# Patient Record
Sex: Female | Born: 1950 | Smoking: Never smoker
Health system: Southern US, Community
[De-identification: ages and names within clinical notes are randomized; demographics above are authoritative.]

## PROBLEM LIST (undated history)

## (undated) DIAGNOSIS — E538 Deficiency of other specified B group vitamins: Secondary | ICD-10-CM

## (undated) DIAGNOSIS — J45909 Unspecified asthma, uncomplicated: Secondary | ICD-10-CM

## (undated) HISTORY — PX: EYE SURGERY: SHX253

## (undated) HISTORY — PX: TUBAL LIGATION: SHX77

## (undated) HISTORY — PX: BUNIONECTOMY: SHX129

---

## 2004-11-21 ENCOUNTER — Ambulatory Visit: Payer: Self-pay | Admitting: Obstetrics and Gynecology

## 2005-10-03 ENCOUNTER — Ambulatory Visit: Payer: Self-pay | Admitting: Podiatry

## 2005-11-27 ENCOUNTER — Ambulatory Visit: Payer: Self-pay | Admitting: Obstetrics and Gynecology

## 2006-12-02 ENCOUNTER — Ambulatory Visit: Payer: Self-pay | Admitting: Obstetrics and Gynecology

## 2007-09-11 ENCOUNTER — Ambulatory Visit: Payer: Self-pay | Admitting: Gastroenterology

## 2007-12-31 ENCOUNTER — Ambulatory Visit: Payer: Self-pay | Admitting: Otolaryngology

## 2008-01-26 ENCOUNTER — Ambulatory Visit: Payer: Self-pay | Admitting: Obstetrics and Gynecology

## 2008-02-26 ENCOUNTER — Ambulatory Visit: Payer: Self-pay | Admitting: Gastroenterology

## 2008-06-17 ENCOUNTER — Ambulatory Visit: Payer: Self-pay | Admitting: Gastroenterology

## 2009-02-15 ENCOUNTER — Ambulatory Visit: Payer: Self-pay | Admitting: Obstetrics and Gynecology

## 2009-04-12 ENCOUNTER — Ambulatory Visit: Payer: Self-pay | Admitting: Otolaryngology

## 2009-05-18 ENCOUNTER — Ambulatory Visit: Payer: Self-pay | Admitting: Otolaryngology

## 2009-08-17 ENCOUNTER — Ambulatory Visit: Payer: Self-pay | Admitting: Gastroenterology

## 2009-10-06 ENCOUNTER — Ambulatory Visit: Payer: Self-pay | Admitting: Podiatry

## 2009-10-12 ENCOUNTER — Ambulatory Visit: Payer: Self-pay | Admitting: Gastroenterology

## 2011-08-14 ENCOUNTER — Ambulatory Visit: Payer: Self-pay | Admitting: Family Medicine

## 2014-02-01 ENCOUNTER — Ambulatory Visit: Payer: Self-pay | Admitting: Family Medicine

## 2015-10-11 ENCOUNTER — Emergency Department: Payer: No Typology Code available for payment source

## 2015-10-11 ENCOUNTER — Emergency Department
Admission: EM | Admit: 2015-10-11 | Discharge: 2015-10-11 | Disposition: A | Discharge status: Home / Self Care | Payer: No Typology Code available for payment source | Attending: Emergency Medicine | Admitting: Emergency Medicine

## 2015-10-11 DIAGNOSIS — R413 Other amnesia: Secondary | ICD-10-CM | POA: Diagnosis present

## 2015-10-11 DIAGNOSIS — G459 Transient cerebral ischemic attack, unspecified: Secondary | ICD-10-CM | POA: Insufficient documentation

## 2015-10-11 HISTORY — DX: Deficiency of other specified B group vitamins: E53.8

## 2015-10-11 HISTORY — DX: Unspecified asthma, uncomplicated: J45.909

## 2015-10-11 LAB — DIFFERENTIAL
BASOS PCT: 1 %
Basophils Absolute: 0 10*3/uL (ref 0–0.1)
EOS PCT: 1 %
Eosinophils Absolute: 0 10*3/uL (ref 0–0.7)
Lymphocytes Relative: 13 %
Lymphs Abs: 1.1 10*3/uL (ref 1.0–3.6)
MONO ABS: 0.6 10*3/uL (ref 0.2–0.9)
Monocytes Relative: 7 %
Neutro Abs: 6.3 10*3/uL (ref 1.4–6.5)
Neutrophils Relative %: 78 %

## 2015-10-11 LAB — CBC
HCT: 41.8 % (ref 35.0–47.0)
Hemoglobin: 14 g/dL (ref 12.0–16.0)
MCH: 31 pg (ref 26.0–34.0)
MCHC: 33.4 g/dL (ref 32.0–36.0)
MCV: 92.7 fL (ref 80.0–100.0)
PLATELETS: 230 10*3/uL (ref 150–440)
RBC: 4.51 MIL/uL (ref 3.80–5.20)
RDW: 12.9 % (ref 11.5–14.5)
WBC: 8.1 10*3/uL (ref 3.6–11.0)

## 2015-10-11 LAB — COMPREHENSIVE METABOLIC PANEL
ALT: 22 U/L (ref 14–54)
ANION GAP: 6 (ref 5–15)
AST: 24 U/L (ref 15–41)
Albumin: 4.8 g/dL (ref 3.5–5.0)
Alkaline Phosphatase: 59 U/L (ref 38–126)
BUN: 14 mg/dL (ref 6–20)
CHLORIDE: 104 mmol/L (ref 101–111)
CO2: 28 mmol/L (ref 22–32)
Calcium: 9.7 mg/dL (ref 8.9–10.3)
Creatinine, Ser: 0.91 mg/dL (ref 0.44–1.00)
GFR calc non Af Amer: >60 mL/min (ref >60)
Glucose, Bld: 119 mg/dL — ABNORMAL HIGH (ref 65–99)
POTASSIUM: 4.3 mmol/L (ref 3.5–5.1)
SODIUM: 138 mmol/L (ref 135–145)
Total Bilirubin: 0.8 mg/dL (ref 0.3–1.2)
Total Protein: 7.9 g/dL (ref 6.5–8.1)

## 2015-10-11 LAB — GLUCOSE, CAPILLARY: GLUCOSE-CAPILLARY: 112 mg/dL — AB (ref 65–99)

## 2015-10-11 LAB — TROPONIN I: Troponin I: <0.03 ng/mL (ref <0.031)

## 2015-10-11 LAB — PROTIME-INR
INR: 1.04
PROTHROMBIN TIME: 13.8 s (ref 11.4–15.0)

## 2015-10-11 LAB — APTT: aPTT: 26 seconds (ref 24–36)

## 2015-10-11 MED ORDER — GADOBENATE DIMEGLUMINE 529 MG/ML IV SOLN
15.0000 mL | Freq: Once | INTRAVENOUS | Status: AC | PRN
  Administered 2015-10-11: 15 mL via INTRAVENOUS

## 2015-10-11 MED ORDER — ASPIRIN EC 81 MG PO TBEC: 81.0000 mg | DELAYED_RELEASE_TABLET | Freq: Every day | ORAL | Status: AC

## 2015-10-11 MED ORDER — ASPIRIN 81 MG PO CHEW
324.0000 mg | CHEWABLE_TABLET | Freq: Once | ORAL | Status: AC
  Administered 2015-10-11: 324 mg via ORAL
  Filled 2015-10-11: qty 4

## 2015-10-11 NOTE — ED Notes (Signed)
Pt c/o having trouble remembering things today, called a friend around 11am . States she does not remember buying presents under tree, states she does not remember breaking up with boyfriend a month ago but remember him, name etc.. Pt is tearful..  No noted neuro deficits.. Denies any weakness, no  Facial droop..Marland Kitchen

## 2015-10-11 NOTE — ED Notes (Signed)
MD at bedside. 

## 2015-10-11 NOTE — ED Notes (Signed)
Patient transported to MRI 

## 2015-10-11 NOTE — Consult Note (Signed)
[facility] [place] [facility]   PATIENT NAME: April Figueroa    MR#:  [id number]  DATE OF BIRTH:  1951/09/02  DATE OF ADMISSION:  10/11/2015  PRIMARY CARE PHYSICIAN: No primary care provider on file.   REQUESTING/REFERRING PHYSICIAN: Dr Thomasene Mohair  CHIEF COMPLAINT:   Chief Complaint  Patient presents with  . Memory Loss    HISTORY OF PRESENT ILLNESS:  April Figueroa  is a 64 y.o. female with a known history of B12 deficiency and asthma comes to the emergency room after she had experienced memory loss for several hours. Patient remembers waking up at 7 in the morning. She did some household chores. Thereafter she does not remember but doesn't remember calling a friend and asking about Christmas presence and where she bothered from. Friend came over to see patient and was found somewhat little confused. Brought in the way here to the emergency room. Since being in the emergency room patient has been asymptomatic. At home she did not have any focal weakness slurred speech or dysarthria. Denied any blurred vision either. ER course was pretty unremarkable vitals stable CT head was negative. MRI of the brain was done which is negative for acute infarct.  PAST MEDICAL HISTORY:   Past Medical History  Diagnosis Date  . Asthma   . B12 deficiency     PAST SURGICAL HISTOIRY:   Past Surgical History  Procedure Laterality Date  . Tubal ligation    . Eye surgery    . Bunionectomy      SOCIAL HISTORY:   Social History  Substance Use Topics  . Smoking status: Never Smoker   . Smokeless tobacco: Not on file  . Alcohol Use: No    FAMILY HISTORY:  No family history on file.  DRUG ALLERGIES:   Allergies  Allergen Reactions  . Sudafed [Pseudoephedrine]     Reaction: heart palpitations     REVIEW OF SYSTEMS:   ROS  CONSTITUTIONAL: No fever, fatigue or weakness.  EYES: No blurred or double vision.  EARS, NOSE, AND THROAT: No tinnitus  or ear pain.  RESPIRATORY: No cough, shortness of breath, wheezing or hemoptysis.  CARDIOVASCULAR: No chest pain, orthopnea, edema.  GASTROINTESTINAL: No nausea, vomiting, diarrhea or abdominal pain.  GENITOURINARY: No dysuria, hematuria.  ENDOCRINE: No polyuria, nocturia,  HEMATOLOGY: No anemia, easy bruising or bleeding SKIN: No rash or lesion. MUSCULOSKELETAL: No joint pain or arthritis.   NEUROLOGIC: No tingling, numbness, weakness. Positive for memory loss transient PSYCHIATRY: No anxiety or depression.   MEDICATIONS AT HOME:   Prior to Admission medications   Medication Sig Start Date End Date Taking? Authorizing Provider  cyanocobalamin 100 MCG tablet Take 100 mcg by mouth daily.   Yes Historical Provider, MD  aspirin EC 81 MG tablet Take 1 tablet (81 mg total) by mouth daily. 10/11/15   Enedina Finner, MD      VITAL SIGNS:  Blood pressure 134/57, pulse 60, temperature 97.6 F (36.4 C), temperature source Oral, resp. rate 18, height  (1.6 m), weight 75.751 kg (167 lb), SpO2 99 %.  PHYSICAL EXAMINATION:  GENERAL:  64 y.o.-year-old patient lying in the bed with no acute distress.  EYES: Pupils equal, round, reactive to light and accommodation. No scleral icterus. Extraocular muscles intact.  HEENT: Head atraumatic, normocephalic. Oropharynx and nasopharynx clear.  NECK:  Supple, no jugular venous distention. No thyroid enlargement, no tenderness.  LUNGS: Normal breath sounds bilaterally, no wheezing, rales,rhonchi or crepitation. No use of accessory  muscles of respiration.  CARDIOVASCULAR: S1, S2 normal. No murmurs, rubs, or gallops.  ABDOMEN: Soft, nontender, nondistended. Bowel sounds present. No organomegaly or mass.  EXTREMITIES: No pedal edema, cyanosis, or clubbing.  NEUROLOGIC: Cranial nerves II through XII are intact. Muscle strength 5/5 in all extremities. Sensation intact. Gait not checked.  PSYCHIATRIC: The patient is alert and oriented x 3.  SKIN: No obvious  rash, lesion, or ulcer.   LABORATORY PANEL:   CBC  Recent Labs Lab 10/11/15 1300  WBC 8.1  HGB 14.0  HCT 41.8  PLT 230   ------------------------------------------------------------------------------------------------------------------  Chemistries   Recent Labs Lab 10/11/15 1300  NA 138  K 4.3  CL 104  CO2 28  GLUCOSE 119*  BUN 14  CREATININE 0.91  CALCIUM 9.7  AST 24  ALT 22  ALKPHOS 59  BILITOT 0.8   ------------------------------------------------------------------------------------------------------------------  Cardiac Enzymes  Recent Labs Lab 10/11/15 1300  TROPONINI <0.03   ------------------------------------------------------------------------------------------------------------------  RADIOLOGY:  Ct Head Wo Contrast  10/11/2015  CLINICAL DATA:  Altered mental status. EXAM: CT HEAD WITHOUT CONTRAST TECHNIQUE: Contiguous axial images were obtained from the base of the skull through the vertex without intravenous contrast. COMPARISON:  MRI 04/12/2009 FINDINGS: No acute intracranial abnormality. Specifically, no hemorrhage, hydrocephalus, mass lesion, acute infarction, or significant intracranial injury. No acute calvarial abnormality. Visualized paranasal sinuses and mastoids clear. Orbital soft tissues unremarkable. IMPRESSION: Negative. Electronically Signed   By: Charlett NoseKevin  Dover M.D.   On: 10/11/2015 12:54   Mr Laqueta JeanBrain W ZOWo Contrast  10/11/2015  CLINICAL DATA:  Acute onset of short-term memory loss this morning. EXAM: MRI HEAD WITHOUT AND WITH CONTRAST TECHNIQUE: Multiplanar, multiecho pulse sequences of the brain and surrounding structures were obtained without and with intravenous contrast. CONTRAST:  15mL MULTIHANCE GADOBENATE DIMEGLUMINE 529 MG/ML IV SOLN COMPARISON:  CT head without contrast from the same day. MRI brain 04/12/2009 FINDINGS: The diffusion-weighted images demonstrate no evidence for acute or subacute infarction. No acute hemorrhage or mass  lesion is present. There is no significant white matter disease. The ventricles are of normal size. No significant extra-axial fluid collection is present. The internal auditory canals are within normal limits. Flow is present in the major intracranial arteries. The globes and orbits are intact. Mild ethmoid and frontal sinus mucosal thickening is present. Bilateral maxillary and ethmoid sinus surgery is again noted. The left maxillary sinuses shrunken. The skullbase is otherwise within normal limits. Midline sagittal images are unremarkable. The postcontrast images demonstrate no pathologic enhancement. IMPRESSION: 1. Normal MRI the brain. No acute or focal lesion to explain the patient's acute memory loss. 2. Stable appearance of maxillary and ethmoid sinus surgery with residual mucosal disease in the anterior ethmoid air cells and frontal sinuses bilaterally. Electronically Signed   By: Marin Robertshristopher  Mattern M.D.   On: 10/11/2015 16:51   Dg Chest Portable 1 View  10/11/2015  CLINICAL DATA:  Trouble remembering things today. EXAM: PORTABLE CHEST 1 VIEW COMPARISON:  None. FINDINGS: The heart size and mediastinal contours are within normal limits. Both lungs are clear. The visualized skeletal structures are unremarkable. IMPRESSION: No active disease. Electronically Signed   By: Charlett NoseKevin  Dover M.D.   On: 10/11/2015 13:44    EKG:   Orders placed or performed during the hospital encounter of 10/11/15  . ED EKG  . ED EKG  . ED EKG  . ED EKG  . EKG 12-Lead  . EKG 12-Lead    IMPRESSION AND PLAN:   64 year old Ms. Forester with past medical  history of asthma, B12 deficiency comes to the emergency room after she had transient memory loss this morning. She was evaluated in the emergency room remained hemodynamically stable. Patient denies any focal weakness. She did not have any focal weakness in the emergency room. CT of the head was negative. Patient remained in sinus rhythm. MRI of the brain was done  did not show any evidence of acute stroke. Patient feels better. Etiology of her transient memory loss unclear at this time. She is recommended to follow up with her primary care physician. She is also advised to follow-up with neurology if the symptoms recur. She was understanding. Patient will be discharged to home and follow up with her primary care physician as needed. She is advised to start a baby aspirin 81 mg daily.    All the records are reviewed and case discussed with Consulting provider. Management plans discussed with the patient, family and they are in agreement.  CODE STATUS: Full  TOTAL TIME TAKING CARE OF THIS PATIENT:45 minutes.    Lexis Potenza M.D on 10/11/2015 at 5:29 PM  Between 7am to 6pm - Pager - [phone]  After 6pm go to [email or web address] - password [email or web address]  [facility]  Office  [phone]  CC: Primary care Physician: No primary care provider on file.

## 2015-10-11 NOTE — ED Notes (Signed)
Pt back from MRI 

## 2015-10-11 NOTE — ED Provider Notes (Addendum)
[facility] Emergency Department Provider Note  Time seen: 1:26 PM  I have reviewed the triage vital signs and the nursing notes.   HISTORY  Chief Complaint Memory Loss    HPI April Figueroa is a 64 y.o. female with a past medical history of asthma who presents the emergency department with difficulty remembering events this morning. According to the patient she called her friend around 5211 AM saying that she was not able to remember several things including whose trailer was in her driveway, and did not remember placing presents under the Christmas tree. She did not remember her ex boyfriend.She called her friend, and expressed these feelings to him, he states he was able to explain to her some of the memory deficits, for example who her ex-boyfriend is, and she would remember. States that she was talking normal whole time, denies any slurred speech, patient denies any weakness or numbness of any arm or leg. States her long-term memory is intact, she remembers everything before around 10 AM this morning, but there is an hour or 2 this morning or she is having difficulty remembering things. States this has never happened before.     Past Medical History  Diagnosis Date  . Asthma   . B12 deficiency     There are no active problems to display for this patient.   Past Surgical History  Procedure Laterality Date  . Tubal ligation    . Eye surgery    . Bunionectomy      No current outpatient prescriptions on file.  Allergies Review of patient's allergies indicates no known allergies.  No family history on file.  Social History Social History  Substance Use Topics  . Smoking status: Never Smoker   . Smokeless tobacco: None  . Alcohol Use: No    Review of Systems Constitutional: Negative for fever. Positive short-term memory impairment. Cardiovascular: Negative for chest pain. Respiratory: Negative for shortness of breath. Gastrointestinal:  Negative for abdominal pain Musculoskeletal: Negative for back pain. Neurological: Negative for headache. Denies focal weakness or numbness. 10-point ROS otherwise negative.  ____________________________________________   PHYSICAL EXAM:  VITAL SIGNS: ED Triage Vitals  Enc Vitals Group     BP 10/11/15 1227 154/62 mmHg     Pulse Rate 10/11/15 1227 71     Resp 10/11/15 1227 18     Temp 10/11/15 1227 97.6 F (36.4 C)     Temp Source 10/11/15 1227 Oral     SpO2 10/11/15 1227 98 %     Weight 10/11/15 1227 167 lb (75.751 kg)     Height 10/11/15 1227 5\' 3"  (1.6 m)     Head Cir --      Peak Flow --      Pain Score --      Pain Loc --      Pain Edu? --      Excl. in GC? --    Constitutional: Alert and oriented 4 Well appearing and in no distress. Eyes: Normal exam ENT   Head: Normocephalic and atraumatic.   Mouth/Throat: Mucous membranes are moist. Cardiovascular: Normal rate, regular rhythm. No murmur Respiratory: Normal respiratory effort without tachypnea nor retractions. Breath sounds are clear Gastrointestinal: Soft and nontender. No distention.  Musculoskeletal: Nontender with normal range of motion in all extremities. No lower extremity tenderness or edema. Neurologic:  Normal speech and language. No gross focal neurologic deficits are appreciated. Equal grip strengths. No pronator drift. 5/5 motor in all extremities. Cranial nerves intact.  Skin:  Skin is warm, dry and intact.  Psychiatric: Mood and affect are normal. Speech and behavior are normal.  ____________________________________________    EKG  EKG reviewed and interpreted by myself shows normal sinus rhythm at 64 bpm, narrow QRS, normal axis, normal intervals, no ST changes. Overall normal EKG.  ____________________________________________    RADIOLOGY  CT head negative  ____________________________________________   INITIAL IMPRESSION / ASSESSMENT AND PLAN / ED COURSE  Pertinent labs &  imaging results that were available during my care of the patient were reviewed by me and considered in my medical decision making (see chart for details).  Patient presents with memory impairment this morning. Now states she feels back to normal. Symptoms most consistent with transient ischemic attack. CT head negative, currently awaiting lab results. We will dose aspirin while awaiting lab results.  Patient's labs are within normal limits. EKG is normal. I discussed with the patient MRI in the emergency department, and possibility of outpatient workup. The patient is very concerned as this has never happened before, and would much prefer to be admitted to the hospital for a stroke workup. Given the patient's deficits to believe this is warranted, we will admit to the hospital for further treatment and evaluation.   NIH Stroke Scale   Interval: Baseline Time: 1:51 PM Person Administering Scale: Devarion Mcclanahan  Administer stroke scale items in the order listed. Record performance in each category after each subscale exam. Do not go back and change scores. Follow directions provided for each exam technique. Scores should reflect what the patient does, not what the clinician thinks the patient can do. The clinician should record answers while administering the exam and work quickly. Except where indicated, the patient should not be coached (i.e., repeated requests to patient to make a special effort).   1a  Level of consciousness: 0=alert; keenly responsive  1b. LOC questions:  0=Performs both tasks correctly  1c. LOC commands: 0=Performs both tasks correctly  2.  Best Gaze: 0=normal  3.  Visual: 0=No visual loss  4. Facial Palsy: 0=Normal symmetric movement  5a.  Motor left arm: 0=No drift, limb holds 90 (or 45) degrees for full 10 seconds  5b.  Motor right arm: 0=No drift, limb holds 90 (or 45) degrees for full 10 seconds  6a. motor left leg: 0=No drift, limb holds 90 (or 45) degrees  for full 10 seconds  6b  Motor right leg:  0=No drift, limb holds 90 (or 45) degrees for full 10 seconds  7. Limb Ataxia: 0=Absent  8.  Sensory: 0=Normal; no sensory loss  9. Best Language:  0=No aphasia, normal  10. Dysarthria: 0=Normal  11. Extinction and Inattention: 0=No abnormality  12. Distal motor function: 0=Normal   Total:   0    ____________________________________________   FINAL CLINICAL IMPRESSION(S) / ED DIAGNOSES  Transient ischemic attack   Minna Antis, MD 10/11/15 1350  Minna Antis, MD 10/11/15 1351

## 2017-07-28 IMAGING — MR MR HEAD WO/W CM
12 series · 48 of 48 positions shown · IV contrast (15 ML MULTIHANCE)
Comparison: CT head without contrast from the same day. MRI brain
04/12/2009

CLINICAL DATA: Acute onset of short-term memory loss this morning.

EXAM:
MRI HEAD WITHOUT AND WITH CONTRAST
TECHNIQUE: Multiplanar, multiecho pulse sequences of the brain and surrounding
structures were obtained without and with intravenous contrast.
CONTRAST:  15mL MULTIHANCE GADOBENATE DIMEGLUMINE 529 MG/ML IV SOLN

[Series 2: T1 · sagittal · 5.0mm · 0.45mm/px · 4 of 27 slices shown (1 of 2)]
[im 1/27]
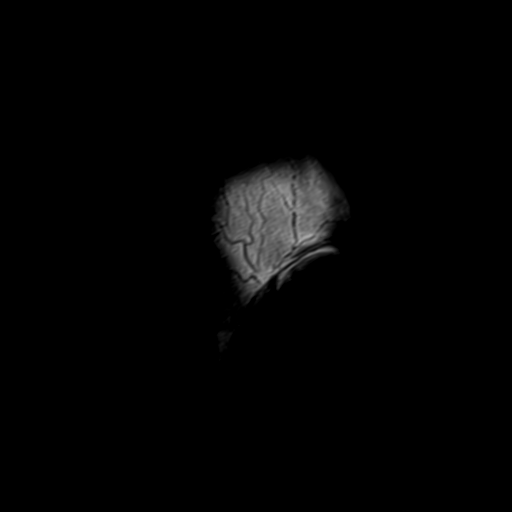
[im 9/27]
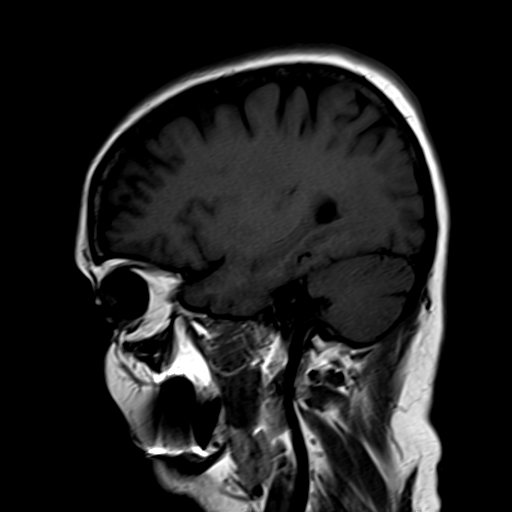
[im 18/27]
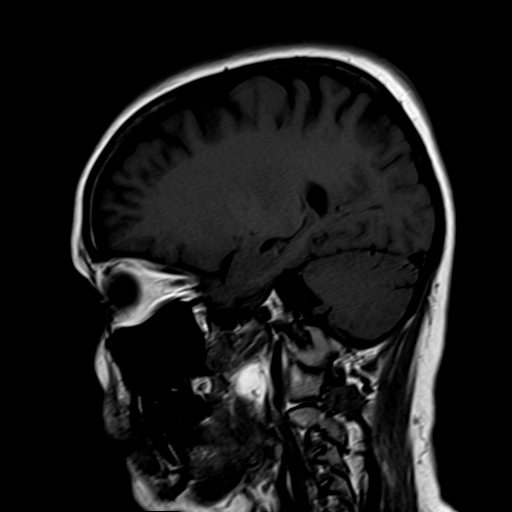
[im 27/27]
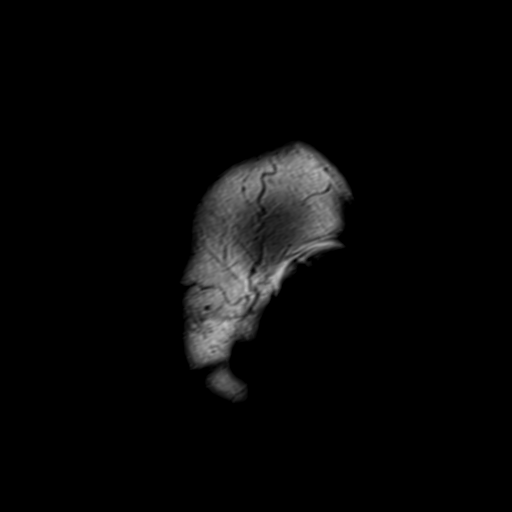

[Series 4: DWI · axial · 3.0mm · 1.80mm/px · z∈[-79,+80]mm · 5 of 54 slices shown (1 of 4)]
[im 1/54]
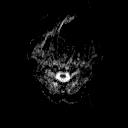
[im 14/54]
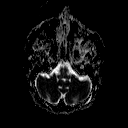
[im 27/54]
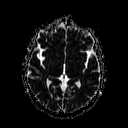
[im 40/54]
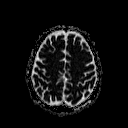
[im 54/54]
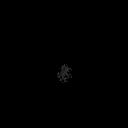

[Series 6: DWI · coronal · 3.0mm · 1.80mm/px · 5 of 46 slices shown (2 of 4)]
[im 1/46]
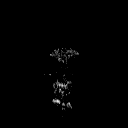
[im 12/46]
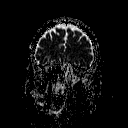
[im 23/46]
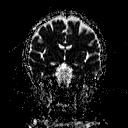
[im 34/46]
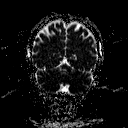
[im 46/46]
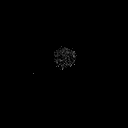

[Series 7: T2 · axial · 5.0mm · 0.60mm/px · z∈[-77,+76]mm · 2 of 25 slices shown (1 of 2)]
[im 1/25]
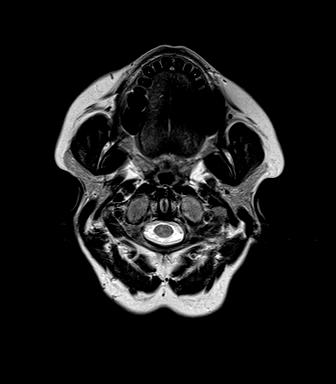
[im 25/25]
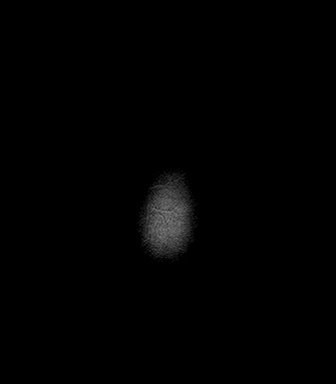

[Series 8: FLAIR · axial · 5.0mm · 0.45mm/px · z∈[-77,+76]mm · 2 of 25 slices shown]
[im 1/25]
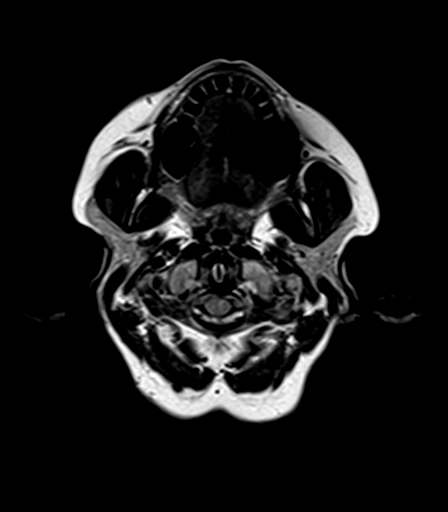
[im 25/25]
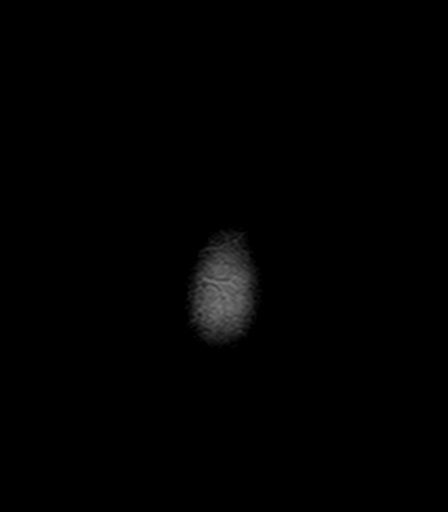

[Series 9: T2 · axial · 5.0mm · 0.45mm/px · z∈[-77,+76]mm · 2 of 25 slices shown (2 of 2)]
[im 1/25]
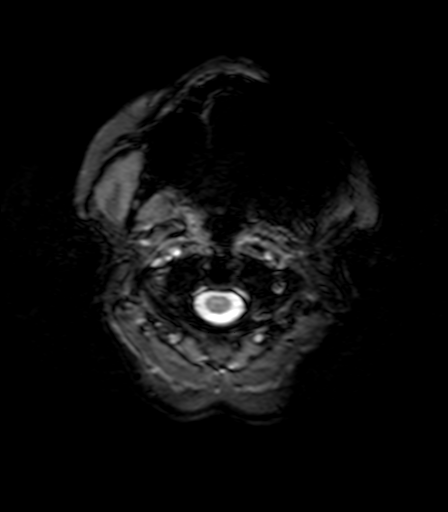
[im 25/25]
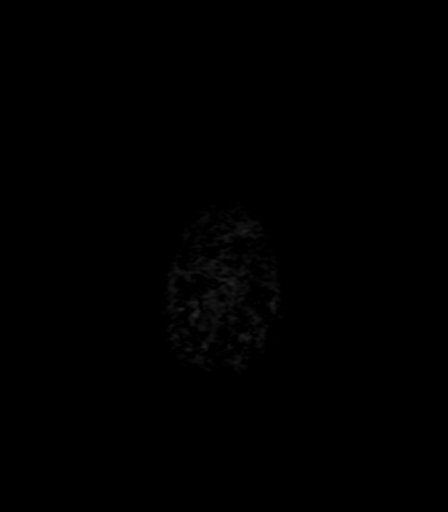

[Series 10: T1 · axial · 3.0mm · 1.00mm/px · z∈[-90,+84]mm · 6 of 60 slices shown (2 of 2)]
[im 1/60]
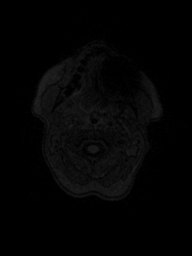
[im 12/60]
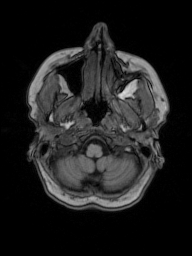
[im 24/60]
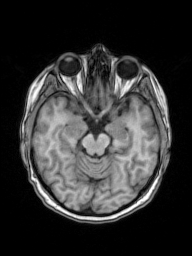
[im 36/60]
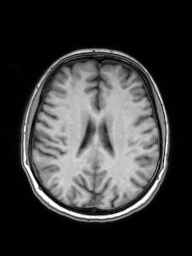
[im 48/60]
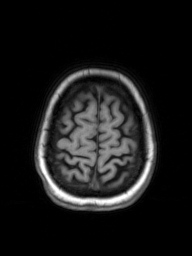
[im 60/60]
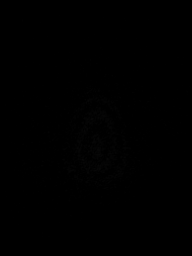

[Series 11: T2 post-contrast · coronal · 5.0mm · 0.49mm/px · 3 of 29 slices shown]
[im 1/29]
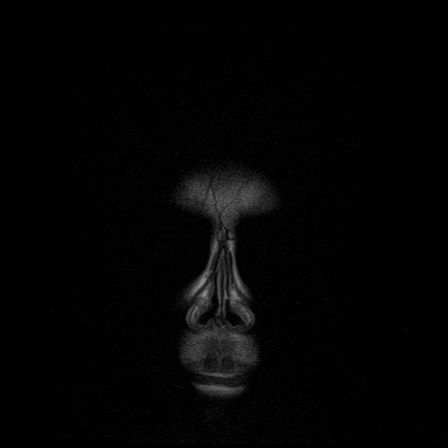
[im 15/29]
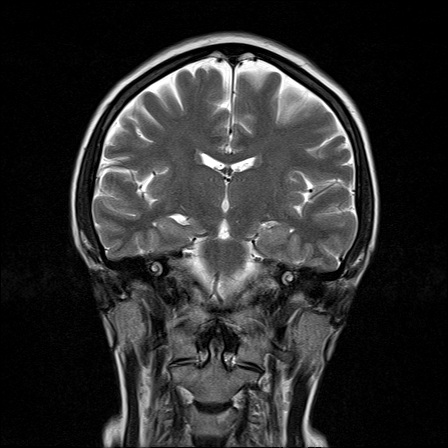
[im 29/29]
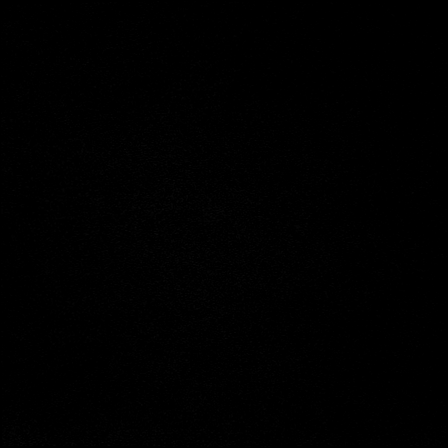

[Series 12: T1 post-contrast · axial · 3.0mm · 1.00mm/px · z∈[-90,+84]mm · 6 of 60 slices shown (1 of 2)]
[im 1/60]
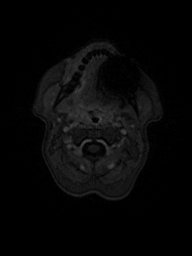
[im 12/60]
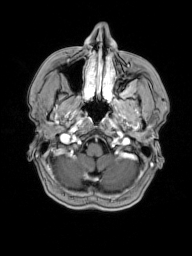
[im 24/60]
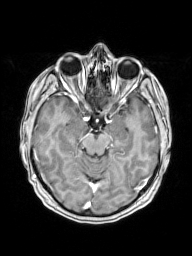
[im 36/60]
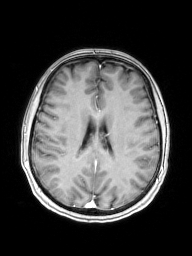
[im 48/60]
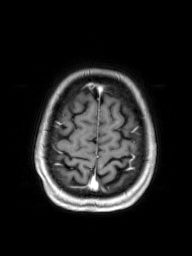
[im 60/60]
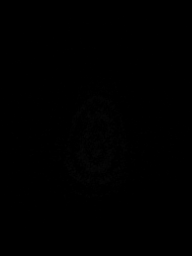

[Series 13: T1 post-contrast · coronal · 5.0mm · 0.43mm/px · 3 of 29 slices shown (2 of 2)]
[im 1/29]
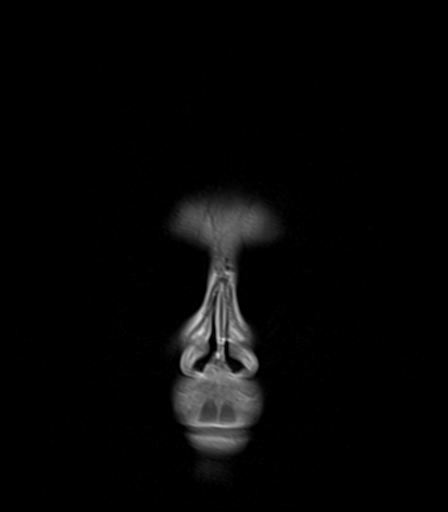
[im 15/29]
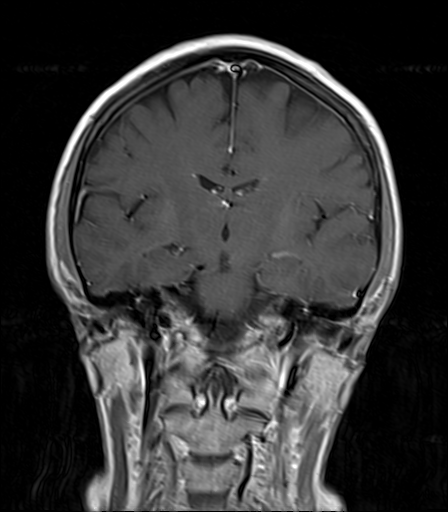
[im 29/29]
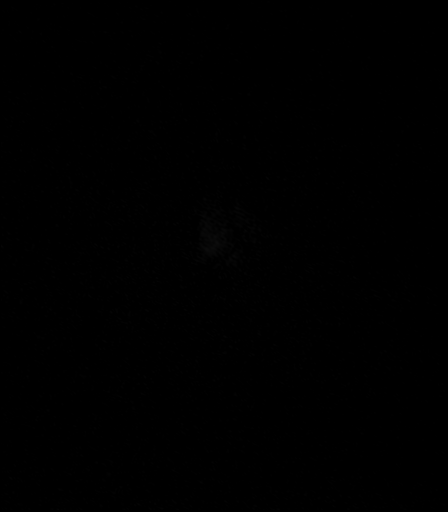

[Series 100: DWI · axial · 3.0mm · 1.80mm/px · z∈[-79,+80]mm · 5 of 55 slices shown (3 of 4)]
[im 1/55]
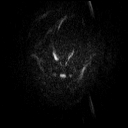
[im 14/55]
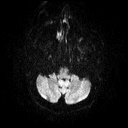
[im 28/55]
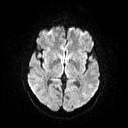
[im 41/55]
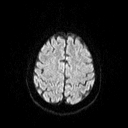
[im 55/55]
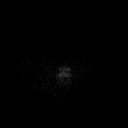

[Series 101: DWI · coronal · 3.0mm · 1.80mm/px · 5 of 46 slices shown (4 of 4)]
[im 1/46]
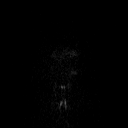
[im 12/46]
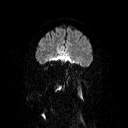
[im 23/46]
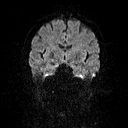
[im 34/46]
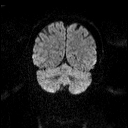
[im 46/46]
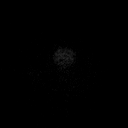

[48 of 48 positions shown; findings below may reference images not displayed]

FINDINGS: The diffusion-weighted images demonstrate no evidence for acute or
subacute infarction. No acute hemorrhage or mass lesion is present.
There is no significant white matter disease. The ventricles are of
normal size. No significant extra-axial fluid collection is present.

The internal auditory canals are within normal limits. Flow is
present in the major intracranial arteries. The globes and orbits
are intact.

Mild ethmoid and frontal sinus mucosal thickening is present.
Bilateral maxillary and ethmoid sinus surgery is again noted. The
left maxillary sinuses shrunken.

The skullbase is otherwise within normal limits. Midline sagittal
images are unremarkable.

The postcontrast images demonstrate no pathologic enhancement.
IMPRESSION: 1. Normal MRI the brain. No acute or focal lesion to explain the
patient's acute memory loss.
2. Stable appearance of maxillary and ethmoid sinus surgery with
residual mucosal disease in the anterior ethmoid air cells and
frontal sinuses bilaterally.
